# Patient Record
Sex: Female | Born: 1935 | Race: White | Hispanic: No | Smoking: Former smoker
Health system: Southern US, Community
[De-identification: ages and names within clinical notes are randomized; demographics above are authoritative.]

## PROBLEM LIST (undated history)

## (undated) DIAGNOSIS — F319 Bipolar disorder, unspecified: Secondary | ICD-10-CM

## (undated) DIAGNOSIS — F419 Anxiety disorder, unspecified: Secondary | ICD-10-CM

## (undated) DIAGNOSIS — E785 Hyperlipidemia, unspecified: Secondary | ICD-10-CM

## (undated) DIAGNOSIS — M1990 Unspecified osteoarthritis, unspecified site: Secondary | ICD-10-CM

## (undated) DIAGNOSIS — N390 Urinary tract infection, site not specified: Secondary | ICD-10-CM

## (undated) HISTORY — PX: DILATION AND CURETTAGE OF UTERUS: SHX78

---
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
## 1999-05-25 HISTORY — PX: CHOLECYSTECTOMY: SHX55

## 2011-10-12 ENCOUNTER — Other Ambulatory Visit: Payer: Self-pay | Admitting: Internal Medicine

## 2011-10-12 DIAGNOSIS — R1031 Right lower quadrant pain: Secondary | ICD-10-CM

## 2011-10-14 ENCOUNTER — Ambulatory Visit
Admission: RE | Admit: 2011-10-14 | Discharge: 2011-10-14 | Disposition: A | Discharge status: Home / Self Care | Payer: Medicare Other | Source: Ambulatory Visit | Attending: Internal Medicine | Admitting: Internal Medicine

## 2011-10-14 DIAGNOSIS — R1031 Right lower quadrant pain: Secondary | ICD-10-CM

## 2011-10-14 MED ORDER — IOHEXOL 300 MG/ML  SOLN
125.0000 mL | Freq: Once | INTRAMUSCULAR | Status: AC | PRN
  Administered 2011-10-14: 125 mL via INTRAVENOUS

## 2011-12-13 ENCOUNTER — Other Ambulatory Visit: Payer: Self-pay | Admitting: Internal Medicine

## 2011-12-13 DIAGNOSIS — R109 Unspecified abdominal pain: Secondary | ICD-10-CM

## 2011-12-17 ENCOUNTER — Ambulatory Visit
Admission: RE | Admit: 2011-12-17 | Discharge: 2011-12-17 | Disposition: A | Discharge status: Home / Self Care | Payer: Medicare Other | Source: Ambulatory Visit | Attending: Internal Medicine | Admitting: Internal Medicine

## 2011-12-17 ENCOUNTER — Other Ambulatory Visit: Payer: Medicare Other

## 2011-12-17 DIAGNOSIS — R109 Unspecified abdominal pain: Secondary | ICD-10-CM

## 2012-01-26 ENCOUNTER — Encounter (HOSPITAL_BASED_OUTPATIENT_CLINIC_OR_DEPARTMENT_OTHER): Payer: Self-pay | Admitting: *Deleted

## 2012-01-26 NOTE — Progress Notes (Signed)
To [facility] at 0715  Hg,Ekg on arrival-Npo after mn.

## 2012-02-01 NOTE — Anesthesia Preprocedure Evaluation (Addendum)
Anesthesia Evaluation  Patient identified by MRN, date of birth, ID band Patient awake    Reviewed: Allergy & Precautions, H&P , NPO status , Patient's Chart, lab work & pertinent test results  Airway Mallampati: II TM Distance: >3 FB Neck ROM: full    Dental  (+) Dental Advisory Given, Missing and Poor Dentition Most of front teeth are missing.  She says none are loose.:   Pulmonary neg pulmonary ROS,  breath sounds clear to auscultation  Pulmonary exam normal       Cardiovascular Exercise Tolerance: Good negative cardio ROS  Rhythm:regular Rate:Normal     Neuro/Psych Anxiety Bipolar Disorder negative neurological ROS  negative psych ROS   GI/Hepatic negative GI ROS, Neg liver ROS,   Endo/Other  negative endocrine ROS  Renal/GU negative Renal ROS  negative genitourinary   Musculoskeletal   Abdominal   Peds  Hematology negative hematology ROS (+)   Anesthesia Other Findings   Reproductive/Obstetrics negative OB ROS                          Anesthesia Physical Anesthesia Plan  ASA: II  Anesthesia Plan: MAC   Post-op Pain Management:    Induction:   Airway Management Planned:   Additional Equipment:   Intra-op Plan:   Post-operative Plan:   Informed Consent: I have reviewed the patients History and Physical, chart, labs and discussed the procedure including the risks, benefits and alternatives for the proposed anesthesia with the patient or authorized representative who has indicated his/her understanding and acceptance.   Dental Advisory Given  Plan Discussed with: CRNA and Surgeon  Anesthesia Plan Comments:        Anesthesia Quick Evaluation

## 2012-02-02 ENCOUNTER — Encounter (HOSPITAL_BASED_OUTPATIENT_CLINIC_OR_DEPARTMENT_OTHER): Payer: Self-pay | Admitting: *Deleted

## 2012-02-02 ENCOUNTER — Ambulatory Visit (HOSPITAL_BASED_OUTPATIENT_CLINIC_OR_DEPARTMENT_OTHER): Payer: Medicare Other | Admitting: Anesthesiology

## 2012-02-02 ENCOUNTER — Encounter (HOSPITAL_BASED_OUTPATIENT_CLINIC_OR_DEPARTMENT_OTHER): Payer: Self-pay | Admitting: Anesthesiology

## 2012-02-02 ENCOUNTER — Encounter (HOSPITAL_BASED_OUTPATIENT_CLINIC_OR_DEPARTMENT_OTHER): Payer: Self-pay

## 2012-02-02 ENCOUNTER — Encounter (HOSPITAL_BASED_OUTPATIENT_CLINIC_OR_DEPARTMENT_OTHER)
Admission: RE | Disposition: A | Discharge status: Home / Self Care | Payer: Self-pay | Source: Ambulatory Visit | Attending: Gynecology

## 2012-02-02 ENCOUNTER — Ambulatory Visit (HOSPITAL_BASED_OUTPATIENT_CLINIC_OR_DEPARTMENT_OTHER)
Admission: RE | Admit: 2012-02-02 | Discharge: 2012-02-02 | Disposition: A | Discharge status: Home / Self Care | Payer: Medicare Other | Source: Ambulatory Visit | Attending: Gynecology | Admitting: Gynecology

## 2012-02-02 DIAGNOSIS — R9389 Abnormal findings on diagnostic imaging of other specified body structures: Secondary | ICD-10-CM | POA: Insufficient documentation

## 2012-02-02 DIAGNOSIS — N8000 Endometriosis of the uterus, unspecified: Secondary | ICD-10-CM | POA: Insufficient documentation

## 2012-02-02 DIAGNOSIS — N840 Polyp of corpus uteri: Secondary | ICD-10-CM | POA: Insufficient documentation

## 2012-02-02 DIAGNOSIS — R1031 Right lower quadrant pain: Secondary | ICD-10-CM | POA: Insufficient documentation

## 2012-02-02 HISTORY — DX: Urinary tract infection, site not specified: N39.0

## 2012-02-02 HISTORY — DX: Anxiety disorder, unspecified: F41.9

## 2012-02-02 HISTORY — DX: Bipolar disorder, unspecified: F31.9

## 2012-02-02 HISTORY — DX: Hyperlipidemia, unspecified: E78.5

## 2012-02-02 HISTORY — DX: Unspecified osteoarthritis, unspecified site: M19.90

## 2012-02-02 LAB — POCT HEMOGLOBIN-HEMACUE: Hemoglobin: 13.6 g/dL (ref 12.0–15.0)

## 2012-02-02 SURGERY — DILATATION & CURETTAGE/HYSTEROSCOPY WITH RESECTOCOPE
Anesthesia: Monitor Anesthesia Care | Site: Uterus | Laterality: Bilateral | Wound class: Clean Contaminated

## 2012-02-02 MED ORDER — LIDOCAINE HCL (CARDIAC) 20 MG/ML IV SOLN
INTRAVENOUS | Status: DC | PRN
  Administered 2012-02-02: 50 mg via INTRAVENOUS

## 2012-02-02 MED ORDER — LACTATED RINGERS IV SOLN
INTRAVENOUS | Status: DC
  Administered 2012-02-02: 08:00:00 via INTRAVENOUS

## 2012-02-02 MED ORDER — FENTANYL CITRATE 0.05 MG/ML IJ SOLN: 25.0000 ug | INTRAMUSCULAR | Status: DC | PRN

## 2012-02-02 MED ORDER — PROPOFOL 10 MG/ML IV BOLUS
INTRAVENOUS | Status: DC | PRN
  Administered 2012-02-02: 40 mg via INTRAVENOUS

## 2012-02-02 MED ORDER — GLYCINE 1.5 % IR SOLN
Status: DC | PRN
  Administered 2012-02-02: 2

## 2012-02-02 MED ORDER — LIDOCAINE HCL (PF) 1 % IJ SOLN
INTRAMUSCULAR | Status: DC | PRN
  Administered 2012-02-02: 10 mL

## 2012-02-02 MED ORDER — LACTATED RINGERS IV SOLN: INTRAVENOUS | Status: DC

## 2012-02-02 MED ORDER — FENTANYL CITRATE 0.05 MG/ML IJ SOLN
INTRAMUSCULAR | Status: DC | PRN
  Administered 2012-02-02 (×2): 50 ug via INTRAVENOUS

## 2012-02-02 MED ORDER — PROPOFOL 10 MG/ML IV EMUL
INTRAVENOUS | Status: DC | PRN
  Administered 2012-02-02: 50 ug/kg/min via INTRAVENOUS

## 2012-02-02 MED ORDER — KETOROLAC TROMETHAMINE 30 MG/ML IJ SOLN
INTRAMUSCULAR | Status: DC | PRN
  Administered 2012-02-02: 15 mg via INTRAVENOUS

## 2012-02-02 MED ORDER — DEXAMETHASONE SODIUM PHOSPHATE 4 MG/ML IJ SOLN
INTRAMUSCULAR | Status: DC | PRN
  Administered 2012-02-02: 10 mg via INTRAVENOUS

## 2012-02-02 SURGICAL SUPPLY — 29 items
CANISTER SUCTION 2500CC (MISCELLANEOUS) ×2 IMPLANT
CATH ROBINSON RED A/P 16FR (CATHETERS) IMPLANT
CLOTH BEACON ORANGE TIMEOUT ST (SAFETY) ×2 IMPLANT
CORD ACTIVE DISPOSABLE (ELECTRODE) ×1
CORD ELECTRO ACTIVE DISP (ELECTRODE) ×1 IMPLANT
COVER TABLE BACK 60X90 (DRAPES) ×2 IMPLANT
DRAPE CAMERA CLOSED 9X96 (DRAPES) ×2 IMPLANT
DRAPE LG THREE QUARTER DISP (DRAPES) ×2 IMPLANT
ELECT LOOP GYNE PRO 24FR (CUTTING LOOP) ×2
ELECT REM PT RETURN 9FT ADLT (ELECTROSURGICAL) ×2
ELECT VAPORTRODE GRVD BAR (ELECTRODE) IMPLANT
ELECTRODE LOOP GYNE PRO 24FR (CUTTING LOOP) ×1 IMPLANT
ELECTRODE REM PT RTRN 9FT ADLT (ELECTROSURGICAL) ×1 IMPLANT
GLOVE BIO SURGEON STRL SZ8 (GLOVE) ×4 IMPLANT
GLOVE ECLIPSE 6.0 STRL STRAW (GLOVE) ×2 IMPLANT
GLOVE INDICATOR 6.5 STRL GRN (GLOVE) ×2 IMPLANT
GLYCINE 1.5% IRRIG UROMATIC (IV SOLUTION) ×4 IMPLANT
GOWN STRL NON-REIN LRG LVL3 (GOWN DISPOSABLE) ×2 IMPLANT
GOWN STRL REIN XL XLG (GOWN DISPOSABLE) ×2 IMPLANT
LEGGING LITHOTOMY PAIR STRL (DRAPES) ×2 IMPLANT
NEEDLE SPNL 22GX3.5 QUINCKE BK (NEEDLE) ×2 IMPLANT
PACK BASIN DAY SURGERY FS (CUSTOM PROCEDURE TRAY) ×2 IMPLANT
PAD OB MATERNITY 4.3X12.25 (PERSONAL CARE ITEMS) ×2 IMPLANT
PAD PREP 24X48 CUFFED NSTRL (MISCELLANEOUS) ×2 IMPLANT
SYR CONTROL 10ML LL (SYRINGE) ×2 IMPLANT
TOWEL OR 17X24 6PK STRL BLUE (TOWEL DISPOSABLE) IMPLANT
TRAY DSU PREP LF (CUSTOM PROCEDURE TRAY) ×2 IMPLANT
TUBING HYDROFLEX HYSTEROSCOPY (TUBING) ×2 IMPLANT
WATER STERILE IRR 500ML POUR (IV SOLUTION) ×2 IMPLANT

## 2012-02-02 NOTE — Anesthesia Postprocedure Evaluation (Signed)
  Anesthesia Post-op Note  Patient: Cassidy Watts  Procedure(s) Performed: Procedure(s) (LRB): DILATATION & CURETTAGE/HYSTEROSCOPY WITH RESECTOCOPE (Bilateral)  Patient Location: PACU  Anesthesia Type: MAC  Level of Consciousness: awake and alert   Airway and Oxygen Therapy: Patient Spontanous Breathing  Post-op Pain: mild  Post-op Assessment: Post-op Vital signs reviewed, Patient's Cardiovascular Status Stable, Respiratory Function Stable, Patent Airway and No signs of Nausea or vomiting  Post-op Vital Signs: stable  Complications: No apparent anesthesia complications

## 2012-02-02 NOTE — Transfer of Care (Signed)
Immediate Anesthesia Transfer of Care Note  Patient: Cassidy Watts  Procedure(s) Performed: Procedure(s) (LRB) with comments: DILATATION & CURETTAGE/HYSTEROSCOPY WITH RESECTOCOPE (Bilateral) - HYSTEROSCOPY D & C WITH RESECTION OF ENDOMETRIUM RESECTOSCOPE   Patient Location: PACU  Anesthesia Type: MAC  Level of Consciousness: awake, alert  and oriented  Airway & Oxygen Therapy: Patient Spontanous Breathing  Post-op Assessment: Report given to PACU RN  Post vital signs: Reviewed  Complications: No apparent anesthesia complications

## 2012-02-03 NOTE — Op Note (Signed)
NAMEMarland Watts  KARALINA, KAMPHAUS NO.:  [id number]  MEDICAL RECORD NO.:  [id number]  LOCATION:                               FACILITY:  [facility]  PHYSICIAN:  Gretta Cool, M.D. DATE OF BIRTH:  14-Feb-1933  DATE OF PROCEDURE:  02/02/2012 DATE OF DISCHARGE:                              OPERATIVE REPORT   PREOPERATIVE DIAGNOSIS:  Endometrial polyp with markedly thickened endometrium and right lower quadrant pain.  Ultrasound and CT showing right lower quadrant paracervical fibroid and endometrial thickening.  POSTOPERATIVE DIAGNOSIS:  Endometrial polyp.  Highly suspicious of malignancy.  PROCEDURE:  Hysteroscopy, resection of endometrial polyp, total.  SURGEON:  Gretta Cool, MD  ANESTHESIA:  IV sedation paracervical block.  DESCRIPTION OF PROCEDURE:  Under excellent anesthesia as above with the patient prepped and draped in Allen stirrups, lithotomy position, a weighted speculum was placed in the vagina and the cervix was grasped with a single-tooth tenaculum.  After paracervical block was applied 4 quadrants, the cervix was progressively dilated with series of Pratt dilators.  The 7 mm resectoscope was then introduced, and the cavity visualized.  The large polyp present was photographed, it was attached multiple sites across the entire endometrial cavity.  It had large mucoid cystic areas in it.  It was also exceedingly vascular.  At this point, the polyp was resected totally from all areas that could be identified, attached to it from the entire fundus of the uterus.  It extended all the way down to the cervix.  All of the tissue that could be visualized was resected and submitted for pathologic exam.  Bleeding was well controlled at the end of the procedure with minimal bleeding at reduced pressure.  There were no complications.  FLUID DEFICIT:  Approximately 200 mL.  COMPLICATIONS:  None.          ______________________________ Gretta Cool,  M.D.     CWL/MEDQ  D:  02/02/2012  T:  02/03/2012  Job:  [id number]  cc:   Dr. Chilton Si  Dr. Glade Lloyd [facility]

## 2012-03-24 ENCOUNTER — Encounter (HOSPITAL_BASED_OUTPATIENT_CLINIC_OR_DEPARTMENT_OTHER): Payer: Self-pay

## 2014-01-10 IMAGING — CT CT ABD-PELV W/ CM
3 of 5 series · 12 of 36 positions shown, 18 images · IV contrast (READICAT/WATER & [ID] OMNI 300)
Comparison: None

CLINICAL DATA: Right lower quadrant abdominal pain.

CT ABDOMEN AND PELVIS WITH CONTRAST
TECHNIQUE: Multidetector CT imaging of the abdomen and pelvis was
performed following the standard protocol during bolus
administration of intravenous contrast.
Contrast: 125mL OMNIPAQUE IOHEXOL 300 MG/ML  SOLN

[Series 3: abd/pelvis with · axial · 0.90mm/px · z∈[-389,-54]mm · 7 of 91 slices shown, 12 images]
[im 12/91  soft-tissue]
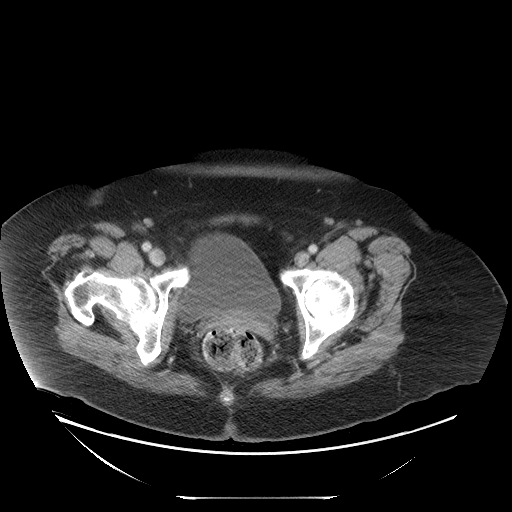
[im 12/91  bone]
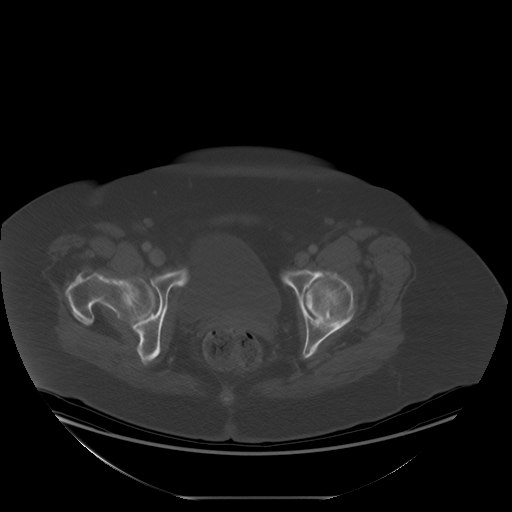
[im 23/91  soft-tissue]
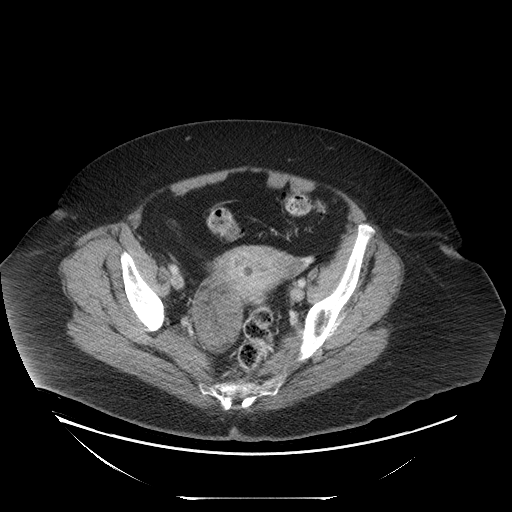
[im 34/91  soft-tissue]
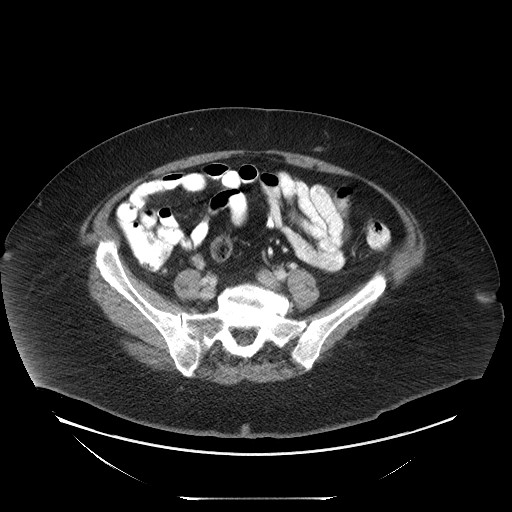
[im 46/91  soft-tissue]
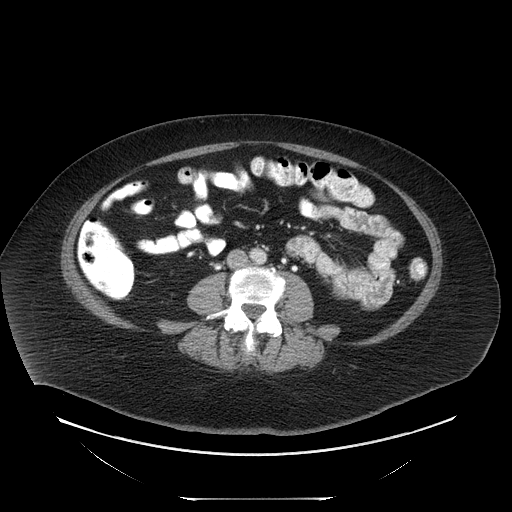
[im 46/91  lung]
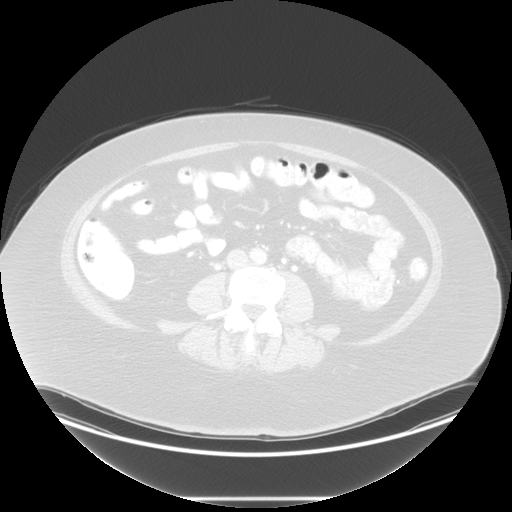
[im 57/91  soft-tissue]
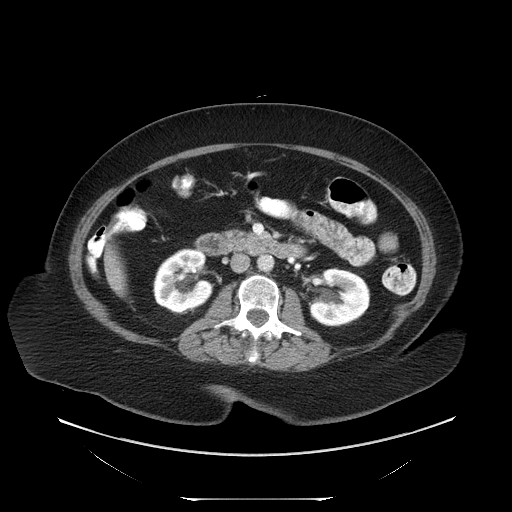
[im 57/91  lung]
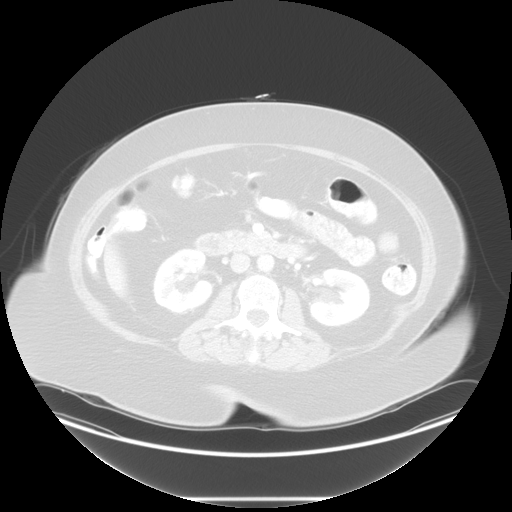
[im 68/91  soft-tissue]
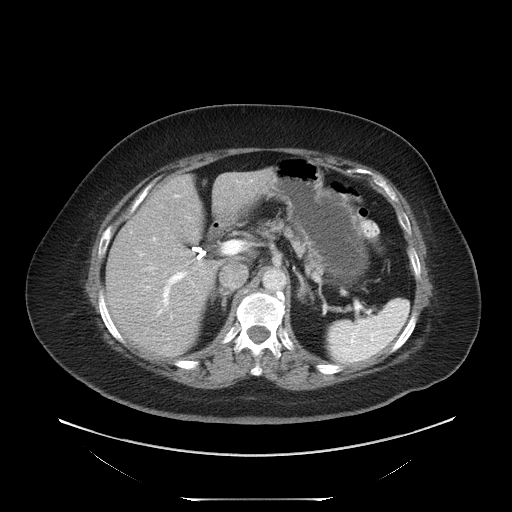
[im 68/91  lung]
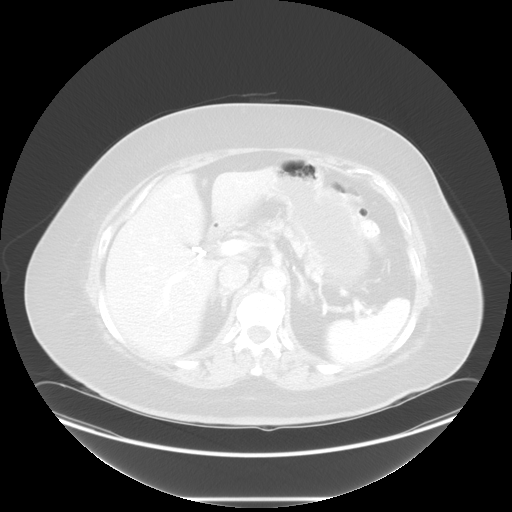
[im 79/91  soft-tissue]
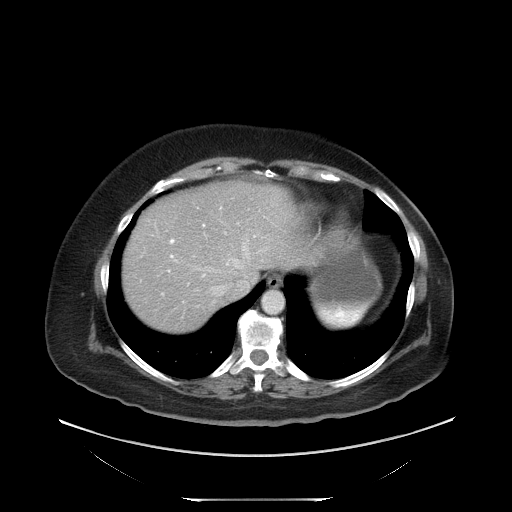
[im 79/91  lung]
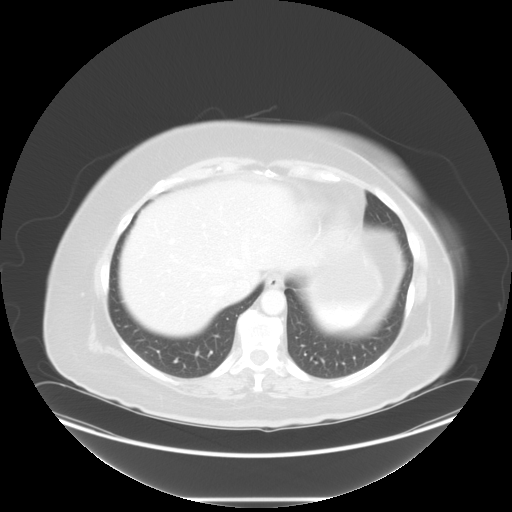

[Series 601: coronal body · coronal · 0.90mm/px · 1 of 126 slices shown, 2 images]
[im 42/126  soft-tissue]
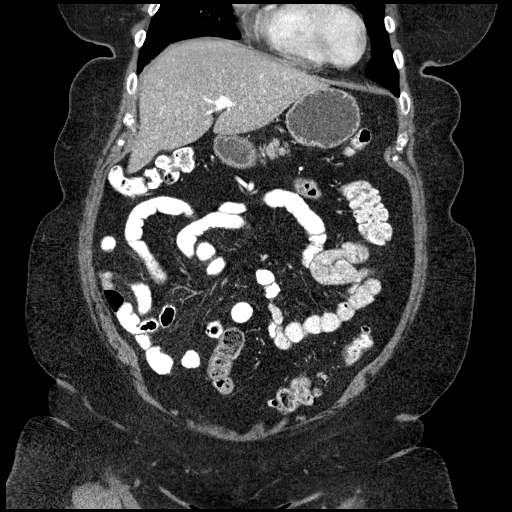
[im 42/126  bone]
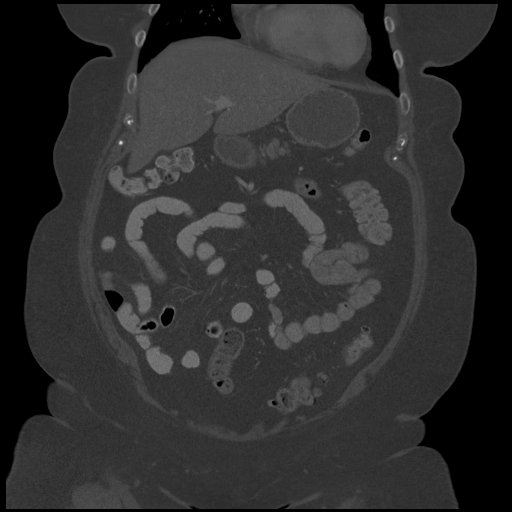

[Series 602: sagittal body · sagittal · 0.90mm/px · 4 of 184 slices shown]
[im 22/184  soft-tissue]
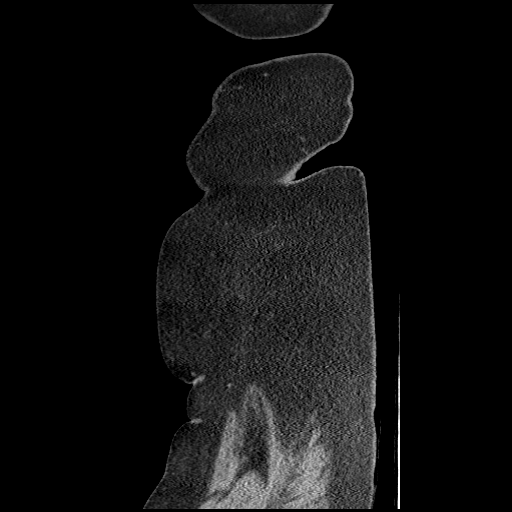
[im 44/184  soft-tissue]
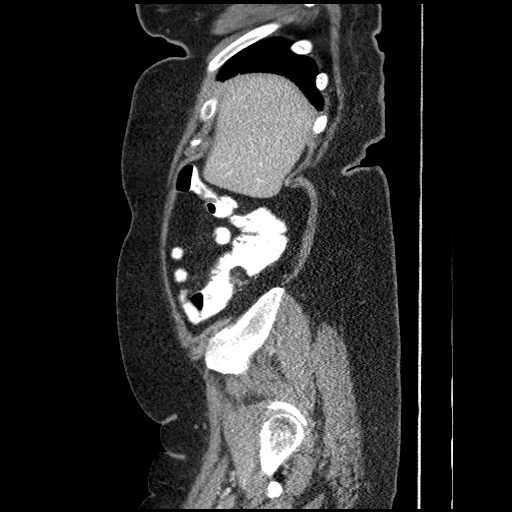
[im 65/184  soft-tissue]
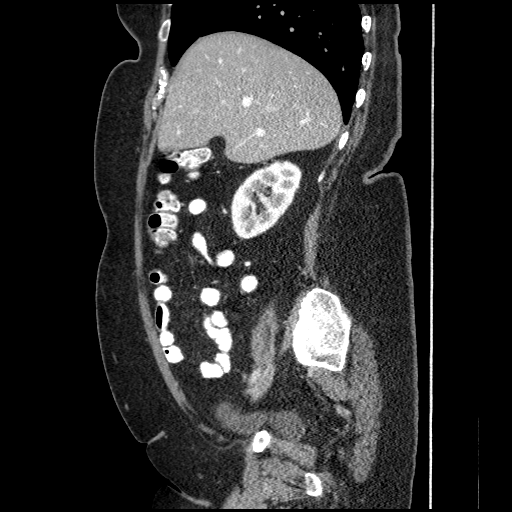
[im 87/184  soft-tissue]
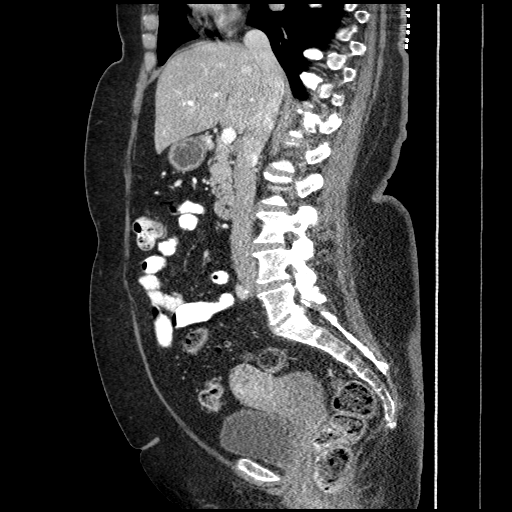

[12 of 36 positions shown; findings below may reference images not displayed]

FINDINGS: The lung bases are clear.  No pleural effusion or
pulmonary nodule.

The liver demonstrates diffuse fatty infiltration.  No focal
hepatic lesions or intrahepatic ductal dilatation.  The portal and
hepatic veins are patent.  The gallbladder is surgically absent.
No common bile duct dilatation.  The pancreas is normal.  The
spleen is normal in size.  No focal lesions.  The adrenal glands
and kidneys are normal.

The stomach, duodenum, small bowel and colon are unremarkable.  No
inflammatory changes or mass lesion.  Scattered colonic
diverticulosis without findings for acute diverticulitis.  The
appendix is normal.  The terminal ileum is normal.  No mesenteric
or retroperitoneal masses or adenopathy.  There are atherosclerotic
calcifications involving the distal aorta.  No aneurysm or
dissection.  The major branch vessels are patent.

The uterus demonstrates a thickened endometrium for age.   It
measures up to 15 mm.  There is also irregularity along the right
aspect of the endometrium.  Possible submucosal fibroid but
endometrial sampling is suggested to exclude endometrial carcinoma.
There is a large exophytic mass projecting from the lower uterine
segment on the right side.  This appears to have a broad based
attachment to the uterus and is most likely a serosal fibroid.
Both ovaries are identified and appeared normal.  No pelvic
lymphadenopathy or free pelvic fluid collection.  No inguinal mass
or hernia.

The bony structures are unremarkable.  The moderate degenerative
changes are noted in the lower thoracic spine.
IMPRESSION: 1.  Diffuse fatty infiltration of the liver but no focal hepatic
lesions or biliary dilatation.
2.  Status post cholecystectomy.
3.  Thickened irregular endometrium for age.  Recommend endometrial
sampling to exclude endometrial cancer.
4.  6.5 x 5.0 cm exophytic lesion projecting from the right side of
the lower uterine segment.  This is most likely a serosal fibroid.
Pelvic ultrasound is recommended for further evaluation of this
lesion and the endometrium.
5.  Both ovaries appear normal.
6.  No acute abdominal/pelvic findings.

## 2016-02-09 ENCOUNTER — Encounter (HOSPITAL_COMMUNITY): Payer: Self-pay | Admitting: Emergency Medicine

## 2016-02-09 ENCOUNTER — Emergency Department (HOSPITAL_COMMUNITY)
Admission: EM | Admit: 2016-02-09 | Discharge: 2016-02-11 | Payer: Medicare Other | Attending: Emergency Medicine | Admitting: Emergency Medicine

## 2016-02-09 DIAGNOSIS — Z87891 Personal history of nicotine dependence: Secondary | ICD-10-CM | POA: Insufficient documentation

## 2016-02-09 DIAGNOSIS — F3112 Bipolar disorder, current episode manic without psychotic features, moderate: Secondary | ICD-10-CM | POA: Insufficient documentation

## 2016-02-09 DIAGNOSIS — Z79899 Other long term (current) drug therapy: Secondary | ICD-10-CM | POA: Insufficient documentation

## 2016-02-09 DIAGNOSIS — Z046 Encounter for general psychiatric examination, requested by authority: Secondary | ICD-10-CM | POA: Diagnosis present

## 2016-02-09 LAB — CBC WITH DIFFERENTIAL/PLATELET
BASOS ABS: 0 10*3/uL (ref 0.0–0.1)
BASOS PCT: 0 %
EOS ABS: 0 10*3/uL (ref 0.0–0.7)
Eosinophils Relative: 0 %
HEMATOCRIT: 40.9 % (ref 36.0–46.0)
HEMOGLOBIN: 12.6 g/dL (ref 12.0–15.0)
Lymphocytes Relative: 19 %
Lymphs Abs: 2 10*3/uL (ref 0.7–4.0)
MCH: 31 pg (ref 26.0–34.0)
MCHC: 30.8 g/dL (ref 30.0–36.0)
MCV: 100.7 fL — ABNORMAL HIGH (ref 78.0–100.0)
MONOS PCT: 8 %
Monocytes Absolute: 0.8 10*3/uL (ref 0.1–1.0)
NEUTROS ABS: 7.4 10*3/uL (ref 1.7–7.7)
NEUTROS PCT: 73 %
Platelets: 184 10*3/uL (ref 150–400)
RBC: 4.06 MIL/uL (ref 3.87–5.11)
RDW: 13.9 % (ref 11.5–15.5)
WBC: 10.1 10*3/uL (ref 4.0–10.5)

## 2016-02-09 LAB — COMPREHENSIVE METABOLIC PANEL
ALBUMIN: 4 g/dL (ref 3.5–5.0)
ALK PHOS: 61 U/L (ref 38–126)
ALT: 17 U/L (ref 14–54)
ANION GAP: 14 (ref 5–15)
AST: 24 U/L (ref 15–41)
BUN: 37 mg/dL — AB (ref 6–20)
CALCIUM: 9.2 mg/dL (ref 8.9–10.3)
CO2: 23 mmol/L (ref 22–32)
Chloride: 103 mmol/L (ref 101–111)
Creatinine, Ser: 2.17 mg/dL — ABNORMAL HIGH (ref 0.44–1.00)
GFR calc Af Amer: 23 mL/min — ABNORMAL LOW (ref >60)
GFR calc non Af Amer: 20 mL/min — ABNORMAL LOW (ref >60)
GLUCOSE: 129 mg/dL — AB (ref 65–99)
POTASSIUM: 4.2 mmol/L (ref 3.5–5.1)
SODIUM: 140 mmol/L (ref 135–145)
Total Bilirubin: 1 mg/dL (ref 0.3–1.2)
Total Protein: 7.1 g/dL (ref 6.5–8.1)

## 2016-02-09 LAB — ETHANOL: Alcohol, Ethyl (B): <5 mg/dL (ref <5)

## 2016-02-09 NOTE — ED Notes (Signed)
Assessment completed- per patient it was very pleasant and it took a long time. She is tucked in, lights dimmed and awaiting disposition.

## 2016-02-09 NOTE — ED Triage Notes (Signed)
[facility] after they were called to her group home, [facility].  Staff at the group home stated the pt was naked stopping cars in the street.  Pt has a history of inappropriate behavior at baseline.  Staff states they also couldn't get her to eat and therefore wanted her evaluated in the emergency department. At this time pt pleasant, appears cooperative, no complaints of pain, following commands.

## 2016-02-09 NOTE — BH Assessment (Addendum)
Tele Assessment Note   Cassidy Watts is an 82 y.o. female.  -Patient was sent by [facility] group home via [facility].  Patient has been refusing to eat or drink.  Today she was in the side yard of the home and praying with her face towards the sun.  Staff at gh say that she had taken her clothes off also and had been approaching cars.    Patient says that last night around 02:30 she heard the [place] command her to go outside and pray and wait.  When asked what she was waiting for she says, "the rapture, the coming of the Lord."  She says that she saw the image of Christ in the clouds last night.  Patient denies any SI or HI.    Patient says that she used to live in [place] in an apartment by herself.  She says that she "left all my worldly possessions behind to follow Christ."  She says she had also been naked on her balcony because she thought she needed her body to be a temple.  Patient was then placed at [facility] for about two and a half months.  She was discharged from there about two and a half weeks ago and was moved into [place].  Marchelle Folks has reported that they took her to [facility] on Saturday evening (02/07/16) because she was again sitting in the sun praying and saying she was doing the Lord's will.  [facility] cleared her and sent her back to [place].  Patient says that she has a court appointed guardian which was done while she was at [facility] in June of this year.  Patient says the person's name is Levada Dy and she only met her once and does not know what agency she is with.    Patient says she has no family.  She had three sons.  Two are dead and one is in a psychiatric facility for killing his paternal uncle because God told him to.  She has a sister in [place] that she barely talks to.  Patient denies any SA issues.  She reports sleeping about 10 hours per night.  She says "I don't eat much, usually just two meals."    Patient has a history of non-compliance  with medications.  She says that she did not take any medications when she as at [place] spoke with Patriciaann Clan, PA who recommended inpatient gero-psych placement.  TTS to make referrals.  Dr. Nat Christen informed of disposition.  Diagnosis: Bi-polar d/o   Past Medical History:  Past Medical History:  Diagnosis Date  . Anxiety   . Arthritis    arms-worse in lt arm  . Bipolar disorder ([place])   . Hyperlipemia   . UTI (urinary tract infection)    past hx    Past Surgical History:  Procedure Laterality Date  . BONE CYST EXCISION  1958   removed from spine  . CHOLECYSTECTOMY  2001  . DILATION AND CURETTAGE OF UTERUS      Family History: No family history on file.  Social History:  reports that she quit smoking about 24 years ago. She has never used smokeless tobacco. She reports that she does not drink alcohol. Her drug history is not on file.  Additional Social History:  Alcohol / Drug Use Pain Medications: Pt is refusing all of her medications. Prescriptions: Pt refuses to take medications.  Says she did not take any while at [facility] in June.  Over the Counter: N/A History of alcohol / drug use?: No history of alcohol / drug abuse  CIWA: CIWA-Ar BP: 129/60 Pulse Rate: 72 COWS:    PATIENT STRENGTHS: (choose at least two) Average or above average intelligence Communication skills  Allergies: No Known Allergies  Home Medications:  (Not in a hospital admission)  OB/GYN Status:  No LMP recorded. Patient is postmenopausal.  General Assessment Data Location of Assessment: [facility] ED TTS Assessment: In system Is this a Tele or Face-to-Face Assessment?: Tele Assessment Is this an Initial Assessment or a Re-assessment for this encounter?: Initial Assessment Marital status: Divorced Is patient pregnant?: No Pregnancy Status: No Living Arrangements: Group Home ([facility] [phone]) Can pt return to current living arrangement?:  Yes Admission Status: Voluntary Is patient capable of signing voluntary admission?: Yes Referral Source: Other ([facility] called an ambulance to bring her.) Insurance type: [place] Arrangements: Group Home ([facility] [phone]) Legal Guardian: Other: (Unknown.  Pt knows guardian's name is Photographer.) Name of Psychiatrist: None Name of Therapist: None  Education Status Is patient currently in school?: No Highest grade of school patient has completed: BS in elementary education  Risk to self with the past 6 months Suicidal Ideation: No Has patient been a risk to self within the past 6 months prior to admission? : No Suicidal Intent: No Has patient had any suicidal intent within the past 6 months prior to admission? : No Is patient at risk for suicide?: No Suicidal Plan?: No Has patient had any suicidal plan within the past 6 months prior to admission? : No Access to Means: No What has been your use of drugs/alcohol within the last 12 months?: None Previous Attempts/Gestures: No How many times?: 0 Other Self Harm Risks: None Triggers for Past Attempts: None known Intentional Self Injurious Behavior: None Family Suicide History: Yes (Father hung himself.) Recent stressful life event(s): Turmoil (Comment) (Pt says she does not have her apartment anymore.) Persecutory voices/beliefs?: No Depression: No Depression Symptoms:  (Pt denies depressive symptoms.) Substance abuse history and/or treatment for substance abuse?: No Suicide prevention information given to non-admitted patients: Not applicable  Risk to Others within the past 6 months Homicidal Ideation: No Does patient have any lifetime risk of violence toward others beyond the six months prior to admission? : No Thoughts of Harm to Others: No Current Homicidal Intent: No Current Homicidal Plan: No Access to Homicidal Means: No Identified Victim: No one History of harm to  others?: No Assessment of Violence: None Noted Violent Behavior Description: None noted Does patient have access to weapons?: No Criminal Charges Pending?: No Does patient have a court date: No Is patient on probation?: No  Psychosis Hallucinations: Auditory, Visual (Hears God speaking to her.  Saw image of Jesus in the sky.) Delusions: Grandiose (Religious delusional thinking)  Mental Status Report Appearance/Hygiene: Disheveled, In scrubs Eye Contact: Fair Motor Activity: Freedom of movement, Unremarkable Speech: Logical/coherent Level of Consciousness: Alert Mood: Helpless Affect: Preoccupied Anxiety Level: None Thought Processes: Relevant Judgement: Unimpaired Orientation: Appropriate for developmental age Obsessive Compulsive Thoughts/Behaviors: Severe  Cognitive Functioning Concentration: Normal Memory: Recent Impaired, Remote Intact IQ: Average Insight: Poor Impulse Control: Poor Appetite: Fair Weight Loss:  ("I don't eat much, two meals a day.") Weight Gain: 0 Sleep: No Change Total Hours of Sleep: 10 Vegetative Symptoms: None  ADLScreening [facility] Assessment Services) Patient's cognitive ability adequate to safely complete daily activities?: Yes Patient able to express  need for assistance with ADLs?: Yes Independently performs ADLs?: Yes (appropriate for developmental age)  Prior Inpatient Therapy Prior Inpatient Therapy: Yes Prior Therapy Dates: Multiple admissions Prior Therapy Facilty/Provider(s): [facility], [facility], [facility] Reason for Treatment: psychosis  Prior Outpatient Therapy Prior Outpatient Therapy: Yes Prior Therapy Dates: Can't recall when Prior Therapy Facilty/Provider(s): [facility] in [place] for 2 years. Reason for Treatment: medication Does patient have an ACCT team?: No Does patient have Intensive In-House Services?  : No Does patient have [facility] services? : No Does patient have P4CC services?: No  ADL Screening (condition at  time of admission) Patient's cognitive ability adequate to safely complete daily activities?: Yes Is the patient deaf or have difficulty hearing?: Yes (Can't hear if background noise is present.) Does the patient have difficulty seeing, even when wearing glasses/contacts?: Yes (Cataract on left eye.) Does the patient have difficulty concentrating, remembering, or making decisions?: Yes Patient able to express need for assistance with ADLs?: Yes Does the patient have difficulty dressing or bathing?: No Independently performs ADLs?: Yes (appropriate for developmental age) Does the patient have difficulty walking or climbing stairs?: No Weakness of Legs: None Weakness of Arms/Hands: None       Abuse/Neglect Assessment (Assessment to be complete while patient is alone) Physical Abuse: Denies Verbal Abuse: Denies Sexual Abuse: Denies Exploitation of patient/patient's resources: Denies Self-Neglect: Denies     Regulatory affairs officer (For Healthcare) Does patient have an advance directive?: No Would patient like information on creating an advanced directive?: No - patient declined information    Additional Information 1:1 In Past 12 Months?: No CIRT Risk: No Elopement Risk: No Does patient have medical clearance?: Yes     Disposition:  Disposition Initial Assessment Completed for this Encounter: Yes Disposition of Patient: Other dispositions Other disposition(s): Other (Comment) (To be reviewed with PA)  Curlene Dolphin Ray 02/09/2016 10:11 PM

## 2016-02-09 NOTE — ED Provider Notes (Addendum)
[facility]-EMER[facility]ENCY DEPT Provider Note   CSN: [id number] Arrival date & time: 02/09/16  1815     History   Chief Complaint No chief complaint on file.   HPI Cassidy Watts is a 82 y.o. female.  Level V caveat for psychiatric disorder. Patient lives in assisted living facility. She states the Lord told her to "pray and fast". She went outdoors and saw an image of Jesus in the clouds. She is hyperreligious making biblical references. Past medical history bipolar disorder. She is a retired Chartered loss adjuster. She is not currently on any psychotropic medicine      Past Medical History:  Diagnosis Date  . Anxiety   . Arthritis    arms-worse in lt arm  . Bipolar disorder (HCC)   . Hyperlipemia   . UTI (urinary tract infection)    past hx    There are no active problems to display for this patient.   Past Surgical History:  Procedure Laterality Date  . BONE CYST EXCISION  1958   removed from spine  . CHOLECYSTECTOMY  2001  . DILATION AND CURETTAGE OF UTERUS      OB History    No data available       Home Medications    Prior to Admission medications   Medication Sig Start Date End Date Taking? Authorizing Provider  magnesium oxide (MAG-OX) 400 MG tablet Take 400 mg by mouth daily.   Yes Historical Provider, MD  Multiple Vitamin (MULTIVITAMIN WITH MINERALS) TABS tablet Take 1 tablet by mouth daily.   Yes Historical Provider, MD    Family History No family history on file.  Social History Social History  Substance Use Topics  . Smoking status: Former Smoker    Quit date: 01/26/1992  . Smokeless tobacco: Never Used  . Alcohol use No     Allergies   Review of patient's allergies indicates no known allergies.   Review of Systems Review of Systems  Reason unable to perform ROS: Psychiatric illness.     Physical Exam Updated Vital Signs BP 129/60 (BP Location: Left Arm)   Pulse 72   Temp 98.1 F (36.7 C) (Oral)   Resp 17   SpO2 98%   Physical  Exam  Constitutional: She is oriented to person, place, and time. She appears well-developed and well-nourished.  HENT:  Head: Normocephalic and atraumatic.  Eyes: Conjunctivae are normal.  Neck: Neck supple.  Cardiovascular: Normal rate and regular rhythm.   Pulmonary/Chest: Effort normal and breath sounds normal.  Abdominal: Soft. Bowel sounds are normal.  Musculoskeletal: Normal range of motion.  Neurological: She is alert and oriented to person, place, and time.  Skin: Skin is warm and dry.  Psychiatric:  Flight of ideas, tangential thinking.  Nursing note and vitals reviewed.    ED Treatments / Results  Labs (all labs ordered are listed, but only abnormal results are displayed) Labs Reviewed  CBC WITH DIFFERENTIAL/PLATELET - Abnormal; Notable for the following:       Result Value   MCV 100.7 (*)    All other components within normal limits  COMPREHENSIVE METABOLIC PANEL - Abnormal; Notable for the following:    Glucose, Bld 129 (*)    BUN 37 (*)    Creatinine, Ser 2.17 (*)    GFR calc non Af Amer 20 (*)    GFR calc Af Amer 23 (*)    All other components within normal limits  ETHANOL  URINE RAPID DRUG SCREEN, HOSP PERFORMED    EKG  EKG Interpretation None       Radiology No results found.  Procedures Procedures (including critical care time)  Medications Ordered in ED Medications - No data to display   Initial Impression / Assessment and Plan / ED Course  I have reviewed the triage vital signs and the nursing notes.  Pertinent labs & imaging results that were available during my care of the patient were reviewed by me and considered in my medical decision making (see chart for details).  Clinical Course    Patient has a history of bipolar disorder. She appears to be in a manic phase. Behavioral health consult obtained. Will seek placement in geri psych  Final Clinical Impressions(s) / ED Diagnoses   Final diagnoses:  Bipolar affective disorder,  currently manic, moderate (HCC)    New Prescriptions New Prescriptions   No medications on file     Donnetta HutchingBrian Alexandru Moorer, MD 02/09/16 [id number]    Donnetta HutchingBrian Kyan Yurkovich, MD 02/10/16 0005

## 2016-02-09 NOTE — ED Notes (Signed)
[facility]- He states that pt has refused all meds since being at the home for the last 2 1/2 weeks. -   s moved into [facility] for her safety and for obsv

## 2016-02-09 NOTE — Progress Notes (Signed)
Per Donell SievertSpencer Simon PA, patient meets criteria for inpatient psychiatric treatment on 9/18. Referrals have been sent to the following hospitals: [facility], [facility], [facility], [facility], [facility], [facility], [facility]  [place] Abtsatia Brylin Stopper, LCSWA Disposition staff 02/09/2016 11:03 PM

## 2016-02-09 NOTE — ED Notes (Signed)
[facility] with DC ordered to take multivitamin and mag- NO MENTION to continue psych meds of Haldol, Ativan, resperdal, as well as other home meds-

## 2016-02-09 NOTE — ED Notes (Signed)
Pt in rm 16- Tina, RN CN informed of need of sitter. Pt is naked and then agrees to put on paper scrubs after being told that they are purple a regal color and she connect this with Isabelle CourseLydia, the seller of purple in the new testament  She states that she refuses to take her psych meds because she does not believe in the practice of psychiatry. She again makes Biblical reference, but then agrees that she believes in physiology and is educated regarding nor epinephrine levels, seritonin levels and the physiology of the brain. She is asked to consider that and the science of the brain  She is sunburned, hyperreligious and conversant

## 2016-02-09 NOTE — ED Notes (Signed)
Spoke with Darel HongJudy, the administrator at [facility], [phone]. States pt has been with them for two and a half weeks. States Friday pt was sitting in the sun praying and lifting her hands up stating she was doing the Lords will. States [facility] to be evaluated and was sent back to them. State pt has not been eating or drinking and refused to come in out of the sun. States she pulled her clothes off while in the yard today and they wrapped her in a blanket and brought her in.

## 2016-02-09 NOTE — ED Notes (Signed)
Dr Adriana Simasook apprised of pt  Non medicated for psych meds since arrival at [facility]

## 2016-02-10 ENCOUNTER — Emergency Department (HOSPITAL_COMMUNITY): Payer: Medicare Other

## 2016-02-10 LAB — URINALYSIS, ROUTINE W REFLEX MICROSCOPIC
BILIRUBIN URINE: NEGATIVE
GLUCOSE, UA: NEGATIVE mg/dL
Glucose, UA: NEGATIVE mg/dL
KETONES UR: 40 mg/dL — AB
Ketones, ur: 40 mg/dL — AB
Leukocytes, UA: NEGATIVE
NITRITE: NEGATIVE
Nitrite: NEGATIVE
PH: 5.5 (ref 5.0–8.0)
PROTEIN: 30 mg/dL — AB
Protein, ur: 30 mg/dL — AB
Specific Gravity, Urine: >1.03 — ABNORMAL HIGH (ref 1.005–1.030)
pH: 5.5 (ref 5.0–8.0)

## 2016-02-10 LAB — URINE MICROSCOPIC-ADD ON

## 2016-02-10 LAB — RAPID URINE DRUG SCREEN, HOSP PERFORMED
Amphetamines: NOT DETECTED
BARBITURATES: NOT DETECTED
BENZODIAZEPINES: NOT DETECTED
COCAINE: NOT DETECTED
Opiates: NOT DETECTED
TETRAHYDROCANNABINOL: NOT DETECTED

## 2016-02-10 MED ORDER — LORAZEPAM 2 MG/ML IJ SOLN
1.0000 mg | Freq: Once | INTRAMUSCULAR | Status: AC
  Administered 2016-02-10: 1 mg via INTRAMUSCULAR

## 2016-02-10 MED ORDER — ZOLPIDEM TARTRATE 5 MG PO TABS: 5.0000 mg | ORAL_TABLET | Freq: Every evening | ORAL | Status: DC | PRN

## 2016-02-10 MED ORDER — LORAZEPAM 2 MG/ML IJ SOLN
INTRAMUSCULAR | Status: AC
  Administered 2016-02-10: 1 mg via INTRAMUSCULAR
  Filled 2016-02-10: qty 1

## 2016-02-10 MED ORDER — ALUM & MAG HYDROXIDE-SIMETH 200-200-20 MG/5ML PO SUSP: 30.0000 mL | ORAL | Status: DC | PRN

## 2016-02-10 MED ORDER — ADULT MULTIVITAMIN W/MINERALS CH
1.0000 | ORAL_TABLET | Freq: Every day | ORAL | Status: DC
  Administered 2016-02-10 – 2016-02-11 (×2): 1 via ORAL
  Filled 2016-02-10 (×2): qty 1

## 2016-02-10 MED ORDER — TRAZODONE HCL 50 MG PO TABS: 50.0000 mg | ORAL_TABLET | Freq: Every day | ORAL | Status: DC

## 2016-02-10 MED ORDER — IBUPROFEN 400 MG PO TABS: 600.0000 mg | ORAL_TABLET | Freq: Three times a day (TID) | ORAL | Status: DC | PRN

## 2016-02-10 MED ORDER — ONDANSETRON HCL 4 MG PO TABS: 4.0000 mg | ORAL_TABLET | Freq: Three times a day (TID) | ORAL | Status: DC | PRN

## 2016-02-10 MED ORDER — RISPERIDONE 1 MG PO TABS
2.0000 mg | ORAL_TABLET | Freq: Every day | ORAL | Status: DC
  Filled 2016-02-10 (×2): qty 2
  Filled 2016-02-10: qty 1

## 2016-02-10 MED ORDER — LORAZEPAM 2 MG/ML IJ SOLN
1.0000 mg | Freq: Once | INTRAMUSCULAR | Status: AC
Start: 2016-02-10 — End: 2016-02-10
  Administered 2016-02-10: 1 mg via INTRAMUSCULAR
  Filled 2016-02-10: qty 1

## 2016-02-10 MED ORDER — HALOPERIDOL LACTATE 5 MG/ML IJ SOLN
5.0000 mg | INTRAMUSCULAR | Status: DC | PRN
Start: 2016-02-10 — End: 2016-02-11
  Administered 2016-02-10 (×2): 5 mg via INTRAMUSCULAR
  Filled 2016-02-10 (×2): qty 1

## 2016-02-10 MED ORDER — LORAZEPAM 1 MG PO TABS
1.0000 mg | ORAL_TABLET | Freq: Three times a day (TID) | ORAL | Status: DC | PRN
  Administered 2016-02-10: 1 mg via ORAL
  Filled 2016-02-10 (×2): qty 1

## 2016-02-10 MED ORDER — MAGNESIUM OXIDE 400 MG PO TABS
400.0000 mg | ORAL_TABLET | Freq: Every day | ORAL | Status: DC
  Administered 2016-02-10 – 2016-02-11 (×2): 400 mg via ORAL
  Filled 2016-02-10 (×4): qty 1

## 2016-02-10 MED ORDER — ACETAMINOPHEN 325 MG PO TABS: 650.0000 mg | ORAL_TABLET | ORAL | Status: DC | PRN

## 2016-02-10 NOTE — ED Notes (Signed)
Per pt, "I live by faith. I want to go outside and welcome the Lord". She is singing a hymn to this RN saying that the song has been place in her heart and she is to go to the Lord's table to sup with Him.   Pt reminded that the Lord found Janice NorrieJacob, Paul, Kirby Funkzekial and other noted Biblical figures where they were and He will find her here. She is encouraged to try to rest and get rest at this time.   Sitter is close by

## 2016-02-10 NOTE — ED Notes (Signed)
Pt out of bed several times and returned to bed- She states"I have an urgent message from Him to come and dine, come and dine..." She reports that she must given the urgent message to all of the patients in the hospital  She cannot be redirected and is assisted to stretcher by security and department staff.

## 2016-02-10 NOTE — ED Notes (Signed)
Pt accepted to [facility], can go after 11am tomorrow (02/11/16).  Bed will be assigned on arrival and accepting is Dr. Tyrell AntonioMyer.  Number for report is [phone].

## 2016-02-10 NOTE — ED Notes (Signed)
Pt out into hallway stating"He is above the hospital. I must go and meet Him. I am the bride and must go. The others are guest at the table but I am the bride."   Pt is assisted back to room and stretcher by security and this RN. She is currently lying on her back, eyes opened with elbows flexed and hands outstretched towards the heavens

## 2016-02-10 NOTE — BHH Counselor (Addendum)
TC from BenkelmanJenny at [facility]. Pt has been accepted by Dr Daphane ShepherdMeyer and # for report is [phone]. [facility] arrive [facility] after 11 am tomorrow.  Writer notified pt's RN [employer].  Education officer, environmentalWriter left voicemail for Sunday [place]ris at [facility] and let him know [facility]. Writer called [facility] and left voicemail for admissions office that [facility].  Writer left voicemail for Honor Junesngela White, DSS guardian, [phone] to notify her pt accepted [facility].  Evette Cristalaroline Paige Erastus Bartolomei, [place] Therapeutic Triage Specialist

## 2016-02-10 NOTE — ED Notes (Signed)
Pt is drinking chicken broth at this time.  Is alert and cooperative.

## 2016-02-10 NOTE — ED Notes (Signed)
Received call from Paige at [facility] stating pt was being considered at a few facilities.  Asked about hydration status and told her pt is now taking po fluids well.

## 2016-02-10 NOTE — ED Notes (Signed)
Pt will not take pills per her report and [facility] report

## 2016-02-10 NOTE — ED Notes (Signed)
Pt continues to attempt to leave the bed and is assisted to a reclining position. She sings and continues to speak of her urgent message to all of the patients she sees moving  She continues to speak of the supper she must attend as God's bride (She reports she is the bride), all others are guest

## 2016-02-10 NOTE — ED Notes (Signed)
Pt continues to refuse food, but drank cup of chicken broth and is currently drinking water.  Has not discussed religion this shift other than to ask for a Bible.  No longer states she is fasting, but states she is not hungry.

## 2016-02-10 NOTE — ED Notes (Signed)
Pt is no longer "fasting" and is eating her supper tray.

## 2016-02-10 NOTE — ED Notes (Signed)
Pt attempting to get otu of bed, RN and techs x 2 into room to help assist pt back into bed and to reposition for comfort. Pt reports, "He came to me at 230 saying come and dine, come and dine- Come to my table. He is above the hospital saying come and dine".

## 2016-02-10 NOTE — ED Notes (Signed)
Pt is refusing to take night time medications.
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
## 2016-02-10 NOTE — Progress Notes (Signed)
Reviewed patient's chart including current medication regimen. Discussed with Dr. Lucianne MussKumar who recommends continuing the Risperdal 2 mg at bedtime for psychosis at current dose as the patient was non-compliant prior to admission. Continue Haldol 5 mg IM every four hours as needed for acute agitation. Will continue to seek gero-psych placement for patient per TTS staff. Disposition communicated to EDP. Also recommend repeating current urinalysis to rule out UTI.

## 2016-02-10 NOTE — ED Notes (Signed)
Pt ambulatory to bathroom with assist.  Pt combed her hair and urinated and defecated.

## 2016-02-10 NOTE — ED Notes (Signed)
Pt continues to attempt to get out of bed and leave room. Security and sitter aware and monitoring closely. Continue to redirect pt back to bed.

## 2016-02-10 NOTE — ED Notes (Signed)
Pt attempted to elope, Sharlette DenseJohn Wicker, NT and security ([place] and [place]) assisted back to bed.  This nurse attempted to administer Ativan PO (PRN order). Pt would not open mouth and refused to take pill. Will consider other medications if necessary.

## 2016-02-10 NOTE — BHH Counselor (Addendum)
BHH Assessment Progress Note  Writer rec'd call from Superiorolleen at [facility], requesting a chest X-ray for pt. Fax to [phone]. They can accept pt if MD approves due to some elevated lab levels. Will wait to get chest xray before running by MD.   Cassidy ShockSamantha M. Ladona Ridgelaylor, MS, NCC, LPCA Counselor   [phone]   [facility]

## 2016-02-10 NOTE — ED Notes (Signed)
Pt refusing breakfast.  States "I am fasting."

## 2016-02-10 NOTE — BHH Counselor (Signed)
TC from [place] [facility]. He reports his provider wants pt to be hydrated. They will accept pt once pt's labs look better. Draw pt's labs in am and [facility].  Evette Cristalaroline Paige Abyan Cadman, [place] Therapeutic Triage Specialist

## 2016-02-10 NOTE — Progress Notes (Signed)
[facility] Thayer Ohm(Chris) called requesting pt's UA and UDS results. Writer faxed. [place] Jill Side(Colleen) states MD will be reviewing referral today.  Ilean SkillMeghan Bereket Gernert, MSW, LCSW Clinical Social Work, Disposition  02/10/2016 [phone]

## 2016-02-10 NOTE — ED Notes (Signed)
Pt attempted to get out of bed. Staff to room, security to room. Pt redirected to get back in bed. Pt did not leave the room.

## 2016-02-10 NOTE — ED Notes (Signed)
Pt has been very calm and cooperative.  Denies any needs or complaints.

## 2016-02-10 NOTE — ED Notes (Signed)
Soft mittens applied- pt keeps taking them off. Pt is in bed at this time.

## 2016-02-11 NOTE — ED Notes (Signed)
[facility] here for transport at this time

## 2016-02-11 NOTE — ED Notes (Signed)
Pt awake, calm. Denies si/hi. statse "i dont want to hurt anyone". Only hallucinations she has had was Sunday night and saw a visioin of the Lord in the sky. Only hearing voices she is having "is of my shepard".

## 2016-02-11 NOTE — Progress Notes (Signed)
Called pt's legal guardian representative Jannifer Rodneyngela White, [place] [employer] [phone], attempting to inform her of pt's pending transfer [facility].  Faxed copies of guardianship paperwork were not received yesterday - will request copies.  Ilean SkillMeghan Lynde Ludwig, MSW, LCSW Clinical Social Work, Disposition  02/11/2016 [phone]

## 2016-02-11 NOTE — ED Provider Notes (Signed)
BP 144/73 (BP Location: Left Arm)   Pulse 75   Temp 98.1 F (36.7 C) (Oral)   Resp 18   SpO2 100%  Pt awake/alert She has no complaints She is resting comfortably in bed Will transfer via [facility], pt is under IVC   The patient appears reasonably stabilized for transfer considering the current resources, flow, and capabilities available in the ED at this time, and I doubt any other [facility] requiring further screening and/or treatment in the ED prior to transfer.    Zadie Rhineonald Misha Antonini, MD 02/11/16 1049

## 2018-05-09 IMAGING — CR DG CHEST 1V PORT
1 series · 1 of 1 positions shown · non-contrast
Comparison: None.

CLINICAL DATA: Medical clearance for [REDACTED] placement

EXAM:
PORTABLE CHEST 1 VIEW

[ap]
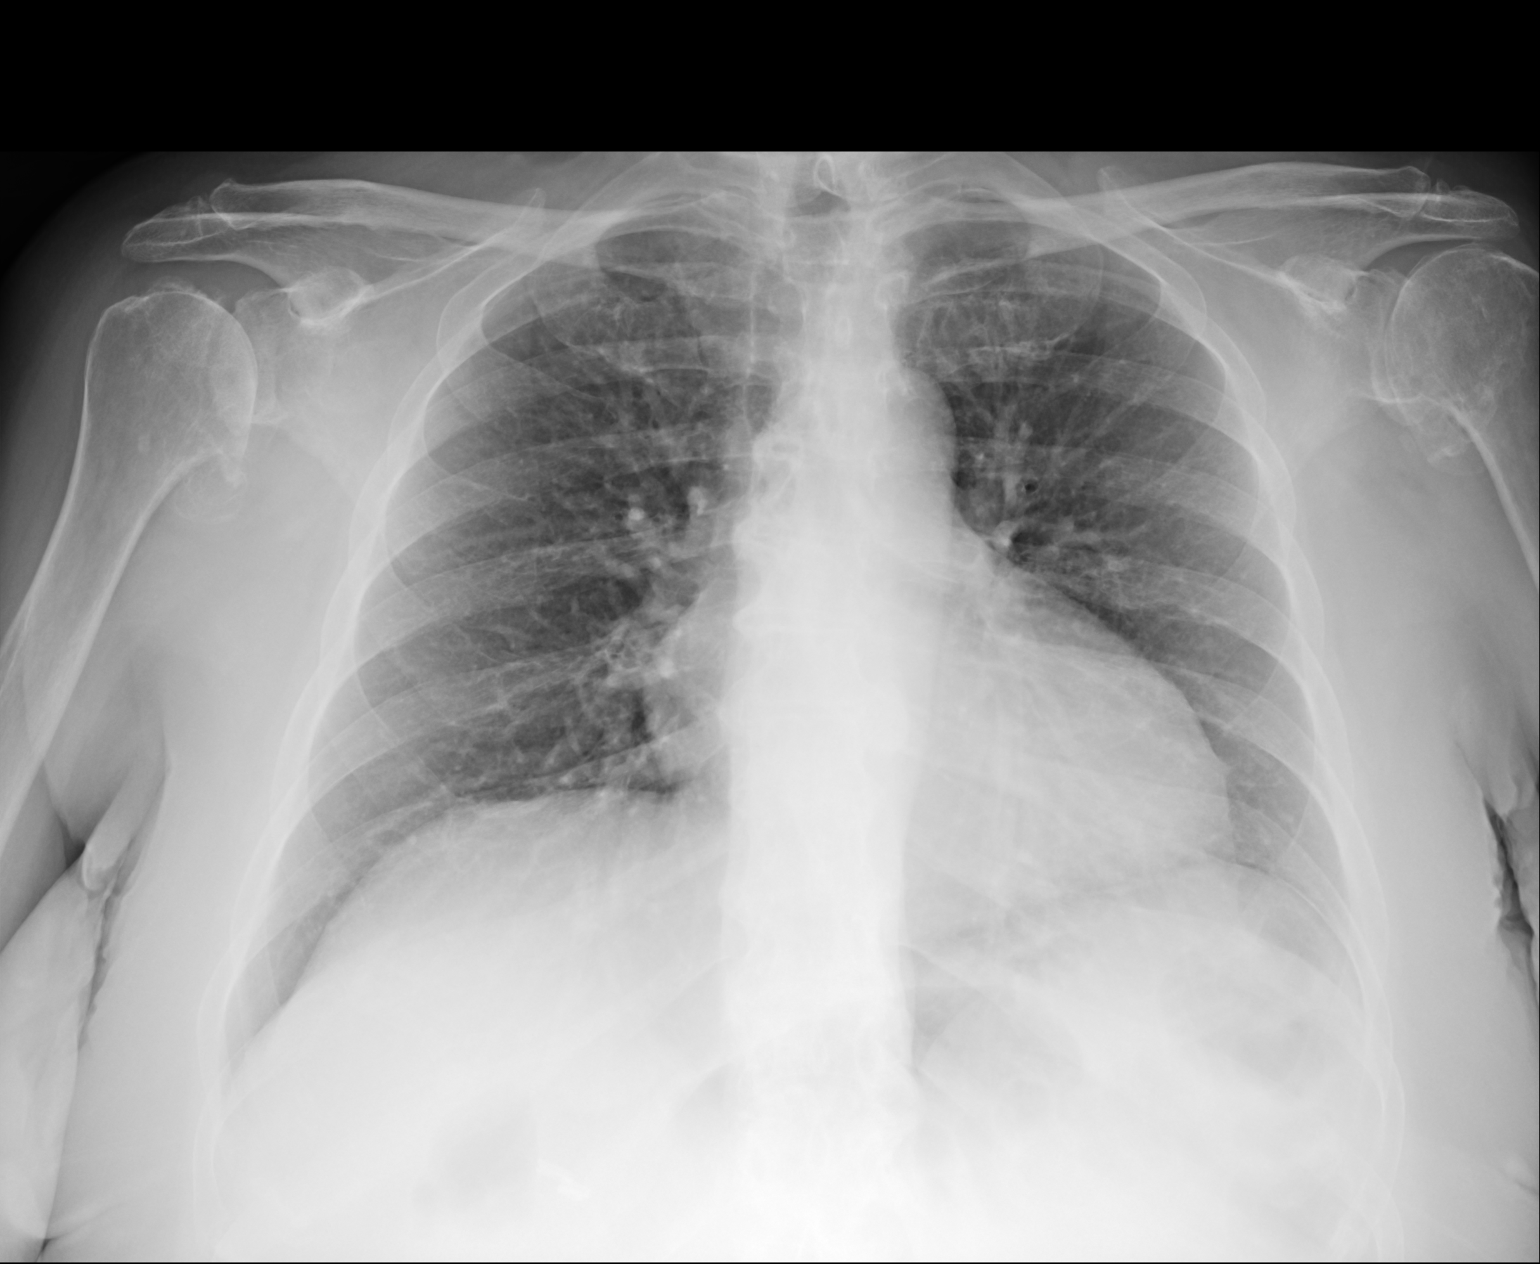

[1 of 1 positions shown; findings below may reference images not displayed]

FINDINGS: Borderline cardiomegaly. No acute infiltrate or pleural effusion. No
pulmonary edema. Mild elevation of the right hemidiaphragm.
Degenerative changes bilateral shoulders.
IMPRESSION: Borderline cardiomegaly. No active disease. Degenerative changes
bilateral shoulders.

## 2019-12-23 DEATH — deceased
